# Patient Record
Sex: Male | Born: 1955 | Race: White | Hispanic: No | Marital: Married
Health system: Southern US, Community
[De-identification: ages and names within clinical notes are randomized; demographics above are authoritative.]

---

## 2016-01-29 DIAGNOSIS — M24572 Contracture, left ankle: Secondary | ICD-10-CM | POA: Insufficient documentation

## 2016-01-29 DIAGNOSIS — M722 Plantar fascial fibromatosis: Secondary | ICD-10-CM | POA: Insufficient documentation

## 2016-11-18 DIAGNOSIS — K219 Gastro-esophageal reflux disease without esophagitis: Secondary | ICD-10-CM | POA: Insufficient documentation

## 2016-11-25 DIAGNOSIS — I10 Essential (primary) hypertension: Secondary | ICD-10-CM | POA: Insufficient documentation

## 2017-01-19 DIAGNOSIS — J309 Allergic rhinitis, unspecified: Secondary | ICD-10-CM | POA: Insufficient documentation

## 2017-01-21 DIAGNOSIS — E7800 Pure hypercholesterolemia, unspecified: Secondary | ICD-10-CM | POA: Insufficient documentation

## 2017-01-21 DIAGNOSIS — R7303 Prediabetes: Secondary | ICD-10-CM | POA: Insufficient documentation

## 2017-07-09 DIAGNOSIS — L719 Rosacea, unspecified: Secondary | ICD-10-CM | POA: Insufficient documentation

## 2018-07-03 DIAGNOSIS — Z0000 Encounter for general adult medical examination without abnormal findings: Secondary | ICD-10-CM | POA: Insufficient documentation

## 2019-07-15 DIAGNOSIS — S42255D Nondisplaced fracture of greater tuberosity of left humerus, subsequent encounter for fracture with routine healing: Secondary | ICD-10-CM | POA: Insufficient documentation

## 2020-01-14 DIAGNOSIS — M542 Cervicalgia: Secondary | ICD-10-CM | POA: Insufficient documentation

## 2020-02-29 ENCOUNTER — Other Ambulatory Visit: Payer: Self-pay | Admitting: Podiatry

## 2020-02-29 ENCOUNTER — Encounter: Payer: Self-pay | Admitting: Podiatry

## 2020-02-29 ENCOUNTER — Ambulatory Visit (INDEPENDENT_AMBULATORY_CARE_PROVIDER_SITE_OTHER): Payer: 59 | Admitting: Podiatry

## 2020-02-29 ENCOUNTER — Ambulatory Visit (INDEPENDENT_AMBULATORY_CARE_PROVIDER_SITE_OTHER): Payer: 59

## 2020-02-29 ENCOUNTER — Other Ambulatory Visit: Payer: Self-pay

## 2020-02-29 DIAGNOSIS — M79671 Pain in right foot: Secondary | ICD-10-CM | POA: Diagnosis not present

## 2020-02-29 DIAGNOSIS — M79672 Pain in left foot: Secondary | ICD-10-CM | POA: Diagnosis not present

## 2020-02-29 DIAGNOSIS — M216X9 Other acquired deformities of unspecified foot: Secondary | ICD-10-CM

## 2020-02-29 DIAGNOSIS — M722 Plantar fascial fibromatosis: Secondary | ICD-10-CM

## 2020-02-29 NOTE — Patient Instructions (Signed)

## 2020-03-02 ENCOUNTER — Telehealth: Payer: Self-pay | Admitting: *Deleted

## 2020-03-02 ENCOUNTER — Encounter: Payer: Self-pay | Admitting: Podiatry

## 2020-03-02 NOTE — Telephone Encounter (Signed)
Pt has called to check the status on the work note he would like to get from Dr.Price  He would to return to work 03/06/20 please advise

## 2020-03-02 NOTE — Telephone Encounter (Signed)
Patient is requesting a work note  to be out for the next few days since he is still having heel pain and injection is not helping.He is unable to ice or stretch because of working 12 hour shifts. Please address the note to Attn:Steven Stoddard at [employer]. Patient will pick up when ready.

## 2020-03-03 NOTE — Telephone Encounter (Signed)
Ok for this note? 

## 2020-03-28 ENCOUNTER — Other Ambulatory Visit: Payer: Self-pay

## 2020-03-28 ENCOUNTER — Ambulatory Visit (INDEPENDENT_AMBULATORY_CARE_PROVIDER_SITE_OTHER): Payer: 59 | Admitting: Podiatry

## 2020-03-28 DIAGNOSIS — M792 Neuralgia and neuritis, unspecified: Secondary | ICD-10-CM | POA: Diagnosis not present

## 2020-03-28 DIAGNOSIS — M722 Plantar fascial fibromatosis: Secondary | ICD-10-CM

## 2020-03-28 MED ORDER — METHYLPREDNISOLONE 4 MG PO TBPK
ORAL_TABLET | ORAL | 0 refills | Status: DC
Start: 2020-03-28 — End: 2020-10-02

## 2020-03-28 NOTE — Progress Notes (Signed)
  Subjective:  Patient ID: Terry Neal, male    DOB: [birth year],  MRN: [id number]  Chief Complaint  Patient presents with  . Foot Pain    BL bottom heel pain -wrose at Rt (nonstop pain) x last Thurs; 6/10 sharp pains -wrose with wlaking -w/ swelling on Rt -pt states he was dx with BL PF 20 yrs ago Tx: IBU, OTC insert -w/ numbness    64 y.o. male presents with the above complaint. History confirmed with patient.   Objective:  Physical Exam: warm, good capillary refill, no trophic changes or ulcerative lesions, normal DP and PT pulses and normal sensory exam. Left Foot: tenderness to palpation medial calcaneal tuber, no pain with calcaneal squeeze, decreased ankle joint ROM and +Silverskiold test Right Foot: tenderness to palpation medial calcaneal tuber, no pain with calcaneal squeeze, decreased ankle joint ROM and +Silverskiold test normal exam, no swelling, tenderness, instability; ligaments intact, full range of motion of all ankle/foot joints other than findings noted above.  Radiographs: X-ray of both feet: no evidence of calcaneal stress fracture, plantar calcaneal spur, posterior calcaneal spur and Haglund deformity noted  Assessment:   1. Plantar fasciitis   2. Equinus deformity of foot   3. Pain of both heels      Plan:  Patient was evaluated and treated and all questions answered.  Plantar Fasciitis -XR reviewed with patient -Educated patient on stretching and icing of the affected limb -Injection delivered to the plantar fascia of both feet. -Dispense Plantar fascial brace  Procedure: Injection Tendon/Ligament Consent: Verbal consent obtained. Location: Bilateral plantar fascia at the glabrous junction; medial approach. Skin Prep: Alcohol. Injectate: 1 cc 0.5% marcaine plain, 1 cc dexamethasone phosphate, 0.5 cc kenalog 10. Disposition: Patient tolerated procedure well. Injection site dressed with a band-aid.  Return in about 3 weeks (around 03/21/2020).

## 2020-03-28 NOTE — Progress Notes (Signed)
°  Subjective:  Patient ID: Terry Neal, male    DOB: 03-11-1956,  MRN: [id number]  Chief Complaint  Patient presents with   Plantar Fasciitis    F/U PF Pt. states,' IF I sit any amount of time it's extremely painful until I walk on it for a while. I also feel like my feet are swelling." - sharp pains on both feet (8/10), pt states he has done a lot of walking Tx: oTC topical, stretching, icing -w/ numbness form BL arches to plantar forefoote    64 y.o. male presents with the above complaint. History confirmed with patient.   Objective:  Physical Exam: warm, good capillary refill, no trophic changes or ulcerative lesions, normal DP and PT pulses and normal sensory exam. Left Foot: tenderness to palpation medial calcaneal tuber, no pain with calcaneal squeeze, decreased ankle joint ROM and +Silverskiold test Right Foot: tenderness to palpation medial calcaneal tuber, no pain with calcaneal squeeze, decreased ankle joint ROM and +Silverskiold test normal exam, no swelling, tenderness, instability; ligaments intact, full range of motion of all ankle/foot joints other than findings noted above.  Assessment:   1. Plantar fasciitis   2. Neuritis      Plan:  Patient was evaluated and treated and all questions answered.  Plantar Fasciitis, possible baxter's neuritis -Rx Medrol Pack. Discussed r/b use of medication -Continue stretching and icing  Return in about 1 month (around 04/27/2020).

## 2020-04-25 ENCOUNTER — Ambulatory Visit (INDEPENDENT_AMBULATORY_CARE_PROVIDER_SITE_OTHER): Payer: 59 | Admitting: Podiatry

## 2020-04-25 ENCOUNTER — Encounter: Payer: Self-pay | Admitting: Podiatry

## 2020-04-25 ENCOUNTER — Other Ambulatory Visit: Payer: Self-pay

## 2020-04-25 DIAGNOSIS — M722 Plantar fascial fibromatosis: Secondary | ICD-10-CM | POA: Diagnosis not present

## 2020-04-25 MED ORDER — MELOXICAM 15 MG PO TABS
15.0000 mg | ORAL_TABLET | Freq: Every day | ORAL | 0 refills | Status: DC
Start: 2020-04-25 — End: 2020-12-07

## 2020-04-25 NOTE — Progress Notes (Signed)
°  Subjective:  Patient ID: Terry Neal, male    DOB: 29-Jun-1956,  MRN: [id number]  Chief Complaint  Patient presents with   Plantar Fasciitis    heel pain is not any better and the medicine nor the shot helped and my feet still swell    64 y.o. male presents with the above complaint. History confirmed with patient.   Objective:  Physical Exam: warm, good capillary refill, no trophic changes or ulcerative lesions, normal DP and PT pulses and normal sensory exam. Left Foot: tenderness to palpation medial calcaneal tuber, no pain with calcaneal squeeze, decreased ankle joint ROM and +Silverskiold test Right Foot: tenderness to palpation medial calcaneal tuber, no pain with calcaneal squeeze, decreased ankle joint ROM and +Silverskiold test normal exam, no swelling, tenderness, instability; ligaments intact, full range of motion of all ankle/foot joints other than findings noted above.  Assessment:   1. Plantar fasciitis      Plan:  Patient was evaluated and treated and all questions answered.  Plantar Fasciitis, possible baxter's neuritis -Rx meloxicam -Refer to PT -Plantar fascial taping applied bilateral  Procedure: Plantar fascial taping. Location: Bilateral plantar fascia Skin Prep: spray adhesive. Technique: Following spray adhesive, a broad piece of moleskine was applied to the plantar foot, followed by interlocking pieces of athletic tape in order to lift and rest the plantar fascia. Disposition: Educated on post-taping care.    No follow-ups on file.

## 2020-05-15 DIAGNOSIS — J0181 Other acute recurrent sinusitis: Secondary | ICD-10-CM | POA: Insufficient documentation

## 2020-05-17 DIAGNOSIS — U071 COVID-19: Secondary | ICD-10-CM | POA: Insufficient documentation

## 2020-06-12 ENCOUNTER — Ambulatory Visit (INDEPENDENT_AMBULATORY_CARE_PROVIDER_SITE_OTHER): Payer: 59 | Admitting: Podiatry

## 2020-06-12 ENCOUNTER — Other Ambulatory Visit: Payer: Self-pay

## 2020-06-12 DIAGNOSIS — M722 Plantar fascial fibromatosis: Secondary | ICD-10-CM | POA: Diagnosis not present

## 2020-06-12 NOTE — Progress Notes (Signed)
  Subjective:  Patient ID: Terry Neal, male    DOB: Jan 02, 1956,  MRN: [id number]  Chief Complaint  Patient presents with  . Plantar Fasciitis    F/U BL PF Pt. states," PT was doing great, feet were doing great until I got hospitalized for 9 days. At hospital feet started swelling and with pain; 4-5/10 uncomfortable pain." - w/ limping Tx: PT and stretching     64 y.o. male presents with the above complaint. History confirmed with patient.   Objective:  Physical Exam: warm, good capillary refill, no trophic changes or ulcerative lesions, normal DP and PT pulses and normal sensory exam. Left Foot: tenderness to palpation medial calcaneal tuber, no pain with calcaneal squeeze, decreased ankle joint ROM and +Silverskiold test Right Foot: tenderness to palpation medial calcaneal tuber, no pain with calcaneal squeeze, decreased ankle joint ROM and +Silverskiold test normal exam, no swelling, tenderness, instability; ligaments intact, full range of motion of all ankle/foot joints other than findings noted above.  Assessment:   1. Plantar fasciitis    Plan:  Patient was evaluated and treated and all questions answered.  Plantar Fasciitis, possible baxter's neuritis -Continue PT -Taping reapplied bilat  Procedure: Plantar fascial taping. Location: Bilateral plantar fascia Skin Prep: spray adhesive. Technique: Following spray adhesive, a broad piece of moleskine was applied to the plantar foot, followed by interlocking pieces of athletic tape in order to lift and rest the plantar fascia. Disposition: Educated on post-taping care.  No follow-ups on file.

## 2020-10-02 ENCOUNTER — Ambulatory Visit (INDEPENDENT_AMBULATORY_CARE_PROVIDER_SITE_OTHER): Payer: 59 | Admitting: Podiatry

## 2020-10-02 ENCOUNTER — Other Ambulatory Visit: Payer: Self-pay

## 2020-10-02 DIAGNOSIS — M792 Neuralgia and neuritis, unspecified: Secondary | ICD-10-CM

## 2020-10-02 DIAGNOSIS — L600 Ingrowing nail: Secondary | ICD-10-CM | POA: Diagnosis not present

## 2020-10-02 DIAGNOSIS — M722 Plantar fascial fibromatosis: Secondary | ICD-10-CM

## 2020-10-02 MED ORDER — METHYLPREDNISOLONE 4 MG PO TBPK: ORAL_TABLET | ORAL | 0 refills | Status: DC

## 2020-10-02 NOTE — Patient Instructions (Signed)

## 2020-10-02 NOTE — Progress Notes (Signed)
  Subjective:  Patient ID: Terry Neal, male    DOB: 1955/12/17,  MRN: [id number]  Chief Complaint  Patient presents with  . Nail Problem    Lt hallux medial border x last week, no pain -w/ redness and slight swelling Tx; polysporin    . Foot Pain    Pt C/o Rt midfoot pain started couple wks ago. Pt denies injury. Tx: PT (with slight improvement)    64 y.o. male presents with the above complaint. History confirmed with patient.   Objective:  Physical Exam: warm, good capillary refill, no trophic changes or ulcerative lesions, normal DP and PT pulses and normal sensory exam.  Painful ingrowing nail at  medial border of the left, hallux; local warmth noted and sanguinous drainage noted. POP 2nd interspace right, left medial calc tuber.  Assessment:   1. Plantar fasciitis   2. Neuritis   3. Ingrown nail    Plan:  Patient was evaluated and treated and all questions answered.  Ingrown Nail, left -Patient elects to proceed with ingrown toenail removal today -Ingrown nail excised. See procedure note. -Educated on post-procedure care including soaking. Written instructions provided.  Procedure: Avulsion of toenail Location: Left 1st toe  Anesthesia: Lidocaine 1% plain; 1.5 mL and Marcaine 0.5% plain; 1.5 mL, digital block. Skin Prep: Betadine. Dressing: Silvadene; telfa; dry, sterile, compression dressing. Technique: Following skin prep, the toe was exsanguinated and a tourniquet was secured at the base of the toe. The medial nail border was freed, split, and avulsed with a hemostat. The area was cleansed. The tourniquet was then removed and sterile dressing applied. Disposition: Patient tolerated procedure well.   Plantar fasciitis left, neuroma right -Rx medrol pack -Consider injections if symptomatic next visit.  Return in about 1 month (around 11/02/2020) for Plantar fasciitis, Nail Check, Neuroma.

## 2020-11-02 ENCOUNTER — Ambulatory Visit: Payer: 59 | Admitting: Podiatry

## 2020-11-09 ENCOUNTER — Other Ambulatory Visit: Payer: Self-pay

## 2020-11-09 ENCOUNTER — Ambulatory Visit (INDEPENDENT_AMBULATORY_CARE_PROVIDER_SITE_OTHER): Payer: 59 | Admitting: Podiatry

## 2020-11-09 ENCOUNTER — Encounter: Payer: Self-pay | Admitting: Podiatry

## 2020-11-09 DIAGNOSIS — M722 Plantar fascial fibromatosis: Secondary | ICD-10-CM

## 2020-11-09 DIAGNOSIS — G5781 Other specified mononeuropathies of right lower limb: Secondary | ICD-10-CM

## 2020-11-09 DIAGNOSIS — L600 Ingrowing nail: Secondary | ICD-10-CM

## 2020-11-09 MED ORDER — BETAMETHASONE SOD PHOS & ACET 6 (3-3) MG/ML IJ SUSP
15.0000 mg | Freq: Once | INTRAMUSCULAR | Status: AC
  Administered 2020-11-09: 15 mg

## 2020-11-09 NOTE — Progress Notes (Signed)
  Subjective:  Patient ID: Royale Swamy, male    DOB: 12/15/1955,  MRN: [id number]  Chief Complaint  Patient presents with  . Nail Problem    The toe is doing fine  . Plantar Fasciitis    The left heel is still hurting and the medicine did good for a while and the heel is starting to hurt again    65 y.o. male presents with the above complaint. History confirmed with patient.   Objective:  Physical Exam: warm, good capillary refill, no trophic changes or ulcerative lesions, normal DP and PT pulses and normal sensory exam.  Ingrown nail site well healed. POP 2nd interspace right, bilatmedial calc tuber.  Assessment:   1. Plantar fasciitis   2. Interdigital neuroma of right foot   3. Ingrown nail    Plan:  Patient was evaluated and treated and all questions answered.  Plantar fasciitis bilat, neuroma right -Injections as below  Procedure: Injection Tendon/Ligament Consent: Verbal consent obtained. Location: Bilateral plantar fascia at the glabrous junction; medial approach. Skin Prep: Alcohol. Injectate: 1 cc 0.5% marcaine plain, 1 cc celestone Disposition: Patient tolerated procedure well. Injection site dressed with a band-aid.  Procedure: Neuroma Injection Location: Right 1st interspace Skin Prep: Alcohol. Injectate: 0.5 cc 0.5% marcaine plain, 0.5 cc celestone phosphate. Disposition: Patient tolerated procedure well. Injection site dressed with a band-aid.  Ingrown Nail -Healing well. Discussed traumatic etiology of nail dystrophy to left 1st and 2nd toenails.  Return in about 3 weeks (around 11/30/2020) for Plantar fasciitis.

## 2020-12-07 ENCOUNTER — Ambulatory Visit (INDEPENDENT_AMBULATORY_CARE_PROVIDER_SITE_OTHER): Payer: 59 | Admitting: Podiatry

## 2020-12-07 ENCOUNTER — Other Ambulatory Visit: Payer: Self-pay

## 2020-12-07 DIAGNOSIS — M722 Plantar fascial fibromatosis: Secondary | ICD-10-CM | POA: Diagnosis not present

## 2020-12-07 DIAGNOSIS — G5781 Other specified mononeuropathies of right lower limb: Secondary | ICD-10-CM

## 2020-12-07 DIAGNOSIS — L600 Ingrowing nail: Secondary | ICD-10-CM | POA: Diagnosis not present

## 2020-12-07 MED ORDER — METHYLPREDNISOLONE 4 MG PO TBPK: ORAL_TABLET | ORAL | 0 refills | Status: DC

## 2020-12-07 MED ORDER — MELOXICAM 15 MG PO TABS: 15.0000 mg | ORAL_TABLET | Freq: Every day | ORAL | 0 refills | Status: DC

## 2020-12-07 NOTE — Progress Notes (Signed)
  Subjective:  Patient ID: Terry Neal, male    DOB: 1956-04-30,  MRN: [id number]  Chief Complaint  Patient presents with  . Plantar Fasciitis    F/U LT PF and Rt neuroma -pt states," a little bit better, I still have pain and with randome intense pain; 10/10 sharp pains." - w/ less swelling, with numbness Tx: stretching, voltaren gel and IBU    65 y.o. male presents with the above complaint. History confirmed with patient.   Objective:  Physical Exam: warm, good capillary refill, no trophic changes or ulcerative lesions, normal DP and PT pulses and normal sensory exam.  Ingrown nail site well healed. POP 2nd interspace right, bilatmedial calc tuber.  Assessment:   No diagnosis found. Plan:  Patient was evaluated and treated and all questions answered.  Plantar fasciitis bilat, neuroma right -Hold off injections today prescribed double anti-inflammatory with Medrol pack and meloxicam.  Patient not to take meloxicam until after completion of the steroid pack.  Patient to continue stretching and icing.  Ingrown Nail left lateral -Debrided in slant back fashion  Return in about 4 weeks (around 01/04/2021).

## 2020-12-29 ENCOUNTER — Other Ambulatory Visit: Payer: Self-pay | Admitting: Podiatry

## 2021-01-04 ENCOUNTER — Ambulatory Visit (INDEPENDENT_AMBULATORY_CARE_PROVIDER_SITE_OTHER): Payer: 59 | Admitting: Podiatry

## 2021-01-04 ENCOUNTER — Other Ambulatory Visit: Payer: Self-pay

## 2021-01-04 DIAGNOSIS — M722 Plantar fascial fibromatosis: Secondary | ICD-10-CM

## 2021-01-04 DIAGNOSIS — G5781 Other specified mononeuropathies of right lower limb: Secondary | ICD-10-CM

## 2021-01-04 DIAGNOSIS — M19079 Primary osteoarthritis, unspecified ankle and foot: Secondary | ICD-10-CM | POA: Diagnosis not present

## 2021-01-04 MED ORDER — BETAMETHASONE SOD PHOS & ACET 6 (3-3) MG/ML IJ SUSP
6.0000 mg | Freq: Once | INTRAMUSCULAR | Status: AC
  Administered 2021-01-04: 6 mg

## 2021-01-04 NOTE — Progress Notes (Signed)
  Subjective:  Patient ID: Terry Neal, male    DOB: 1955/11/26,  MRN: [id number]  Chief Complaint  Patient presents with  . Follow-up    Lt heel doing a lot better- Right top of foot still having pain- mentioned when taking dose pak he felt relief in both feet-    65 y.o. male presents with the above complaint. History confirmed with patient.   Objective:  Physical Exam: warm, good capillary refill, no trophic changes or ulcerative lesions, normal DP and PT pulses and normal sensory exam.  Ingrown nail site well healed. Mild POP medial calc tuber left Right dorsal midfoot POP with prominent osteophytes  Assessment:   1. Arthritis, midfoot   2. Plantar fasciitis   3. Interdigital neuroma of right foot    Plan:  Patient was evaluated and treated and all questions answered.  Plantar fasciitis bilat, neuroma right -Appear improved  Midfoot arthritis right -Injection as below  Procedure: Joint Injection Location: Right 2nd TMT joint Skin Prep: Alcohol. Injectate: 0.5 cc 1% lidocaine plain, 0.5 cc betamethasone acetate-betamethasone sodium phosphate Disposition: Patient tolerated procedure well. Injection site dressed with a band-aid.    ?TTS bilat -Would consider diagnostic injection next visit.  Return in about 6 weeks (around 02/15/2021) for Plantar fasciitis, Arthritis.

## 2021-01-26 ENCOUNTER — Other Ambulatory Visit: Payer: Self-pay | Admitting: Podiatry

## 2021-02-19 ENCOUNTER — Ambulatory Visit (INDEPENDENT_AMBULATORY_CARE_PROVIDER_SITE_OTHER): Payer: 59 | Admitting: Podiatry

## 2021-02-19 ENCOUNTER — Other Ambulatory Visit: Payer: Self-pay

## 2021-02-19 DIAGNOSIS — M722 Plantar fascial fibromatosis: Secondary | ICD-10-CM

## 2021-02-19 DIAGNOSIS — M19071 Primary osteoarthritis, right ankle and foot: Secondary | ICD-10-CM | POA: Diagnosis not present

## 2021-02-19 DIAGNOSIS — M19079 Primary osteoarthritis, unspecified ankle and foot: Secondary | ICD-10-CM

## 2021-02-21 DIAGNOSIS — M19071 Primary osteoarthritis, right ankle and foot: Secondary | ICD-10-CM | POA: Diagnosis not present

## 2021-02-21 DIAGNOSIS — M722 Plantar fascial fibromatosis: Secondary | ICD-10-CM | POA: Diagnosis not present

## 2021-02-21 MED ORDER — BETAMETHASONE SOD PHOS & ACET 6 (3-3) MG/ML IJ SUSP
9.0000 mg | Freq: Once | INTRAMUSCULAR | Status: AC
  Administered 2021-02-21: 9 mg

## 2021-02-21 NOTE — Progress Notes (Signed)
  Subjective:  Patient ID: Terry Neal, male    DOB: 05-20-1956,  MRN: [id number]  Chief Complaint  Patient presents with  . Plantar Fasciitis    F/U BL PF and arthritiis -pt states," improving. Lt foot not as bad but with the Rt pain from ankle down the foot." Tx: meloxicam    65 y.o. male presents with the above complaint. History confirmed with patient.   Objective:  Physical Exam: warm, good capillary refill, no trophic changes or ulcerative lesions, normal DP and PT pulses and normal sensory exam.  Ingrown nail site well healed. Mild POP medial calc tuber left Right dorsal midfoot POP with prominent osteophytes  Assessment:   1. Arthritis, midfoot   2. Plantar fasciitis    Plan:  Patient was evaluated and treated and all questions answered.  Plantar fasciitis bilat, neuroma right -Appears worsened on the left side we discussed getting 1 final injection today we will consider surgical invention should issues persist  Midfoot arthritis right -Still having pain final injections as below  Procedure: Injection Tendon/Ligament Consent: Verbal consent obtained. Location: Left plantar fascia at the glabrous junction; medial approach. Skin Prep: Alcohol. Injectate: 1 cc 0.5% marcaine plain, 1 cc betamethasone acetate-betamethasone sodium phosphate Disposition: Patient tolerated procedure well. Injection site dressed with a band-aid.   Procedure: Joint Injection Location: Right 2nd TMT joint Skin Prep: Alcohol. Injectate: 0.5 cc 1% lidocaine plain, 0.5 cc betamethasone acetate-betamethasone sodium phosphate Disposition: Patient tolerated procedure well. Injection site dressed with a band-aid.  Return in about 1 month (around 03/22/2021) for Plantar fasciitis, Left, Arthritis, Right.

## 2021-03-29 ENCOUNTER — Ambulatory Visit (INDEPENDENT_AMBULATORY_CARE_PROVIDER_SITE_OTHER): Payer: 59 | Admitting: Podiatry

## 2021-03-29 ENCOUNTER — Other Ambulatory Visit: Payer: Self-pay

## 2021-03-29 ENCOUNTER — Encounter: Payer: Self-pay | Admitting: Podiatry

## 2021-03-29 DIAGNOSIS — M722 Plantar fascial fibromatosis: Secondary | ICD-10-CM | POA: Diagnosis not present

## 2021-03-29 DIAGNOSIS — M19079 Primary osteoarthritis, unspecified ankle and foot: Secondary | ICD-10-CM | POA: Diagnosis not present

## 2021-03-29 NOTE — Progress Notes (Signed)
  Subjective:  Patient ID: Terry Neal, male    DOB: 07/10/1956,  MRN: [id number]  Chief Complaint  Patient presents with   Plantar Fasciitis    I am doing ok and maybe a little improvement and it still is stiff and hurts after I get up on both feet    65 y.o. male presents with the above complaint. History confirmed with patient.   Objective:  Physical Exam: warm, good capillary refill, no trophic changes or ulcerative lesions, normal DP and PT pulses and normal sensory exam.  Ingrown nail right lateral great toenail border without warmth erythema signs of infection Mild POP medial calc tuber left Right dorsal midfoot POP with prominent osteophytes  Assessment:   No diagnosis found.  Plan:  Patient was evaluated and treated and all questions answered.  Plantar fasciitis left midfoot arthritis right -Still having significant pain noticed slightly improved.  Discussed with patient midfoot exostectomy right for reduction of pain as well as injection of platelet rich plasma to the left heel.  Discussed surgeries in detail discussed risk benefits and alternatives.  At this point we have maximized conservative therapy.   -Patient has failed all conservative therapy and wishes to proceed with surgical intervention. All risks, benefits, and alternatives discussed with patient. No guarantees given. Consent reviewed and signed by patient. -Planned procedures: Right midfoot exostectomy, left heel injection of platelet rich plasma  Total time for visit both face-to-face and non face-to-face including patient care, review of chart/imaging, documentation: 33 mins    No follow-ups on file.

## 2021-05-17 ENCOUNTER — Ambulatory Visit (INDEPENDENT_AMBULATORY_CARE_PROVIDER_SITE_OTHER): Payer: 59 | Admitting: Podiatry

## 2021-05-17 ENCOUNTER — Encounter: Payer: Self-pay | Admitting: Podiatry

## 2021-05-17 ENCOUNTER — Other Ambulatory Visit: Payer: Self-pay

## 2021-05-17 DIAGNOSIS — M79676 Pain in unspecified toe(s): Secondary | ICD-10-CM

## 2021-05-17 DIAGNOSIS — L601 Onycholysis: Secondary | ICD-10-CM | POA: Diagnosis not present

## 2021-05-17 NOTE — Progress Notes (Signed)
  Subjective:  Patient ID: Per Beagley, male    DOB: 21-Dec-1955,  MRN: [id number]  Chief Complaint  Patient presents with   Plantar Fasciitis    The left heel is ok    Foot Pain    The right ankle area still hurts and I think I have some arthritis and the right top of the foot hurts   Nail Problem    The right big toe nail I trimmed last week and got some puss out last week    65 y.o. male presents with the above complaint. History confirmed with patient.   Objective:  Physical Exam: warm, good capillary refill, no trophic changes or ulcerative lesions, normal DP and PT pulses, and normal sensory exam.  Painful loose nail at the proximal aspect of the right hallux with subungual hemorrhage of the digit notedno warmth no erythema Assessment:   1. Onycholysis   2. Pain around toenail      Plan:  Patient was evaluated and treated and all questions answered.  Onycholysis, right -Discussed with patient performing avulsion due to proximal lysis of the toenail.  Patient agrees to proceed  Procedure: Avulsion of toenail Location: Right 1st toe  Anesthesia: Lidocaine 1% plain; 1.5 mL and Marcaine 0.5% plain; 1.5 mL, digital block. Skin Prep: Betadine. Dressing: Silvadene; telfa; dry, sterile, compression dressing. Technique: Following skin prep, the toe was exsanguinated and a tourniquet was secured at the base of the toe. The nail was freed and avulsed with a hemostat. The area was cleansed. The tourniquet was then removed and sterile dressing applied. Disposition: Patient tolerated procedure well.  No follow-ups on file.   MDM

## 2021-05-31 ENCOUNTER — Other Ambulatory Visit: Payer: Self-pay

## 2021-05-31 ENCOUNTER — Ambulatory Visit (INDEPENDENT_AMBULATORY_CARE_PROVIDER_SITE_OTHER): Payer: 59 | Admitting: Podiatry

## 2021-05-31 DIAGNOSIS — L600 Ingrowing nail: Secondary | ICD-10-CM | POA: Diagnosis not present

## 2021-05-31 DIAGNOSIS — M19071 Primary osteoarthritis, right ankle and foot: Secondary | ICD-10-CM | POA: Diagnosis not present

## 2021-05-31 DIAGNOSIS — M19079 Primary osteoarthritis, unspecified ankle and foot: Secondary | ICD-10-CM | POA: Diagnosis not present

## 2021-05-31 NOTE — Progress Notes (Signed)
  Subjective:  Patient ID: Terry Neal, male    DOB: 1956/08/19,  MRN: [id number]  No chief complaint on file.  65 y.o. male presents with the above complaint. History confirmed with patient. States the nail is not draining, has no pain. He is having continued pain in his ankle and the top of the foot for which he would like to discuss options.   Objective:  Physical Exam: warm, good capillary refill, no trophic changes or ulcerative lesions, normal DP and PT pulses and normal sensory exam.  Right hallux nail bed healing well without issue. Mild POP medial calc tuber left Right dorsal midfoot POP with prominent osteophytes, maximal pain at the 4th TMT area. POP lateral ankle gutter, no pain at medial gutter, no pain at the ATFL. Pain on ankle ROM.  Assessment:   1. Arthritis of ankle, right   2. Arthritis, midfoot   3. Ingrown nail     Plan:  Patient was evaluated and treated and all questions answered.  Right ankle pain, r/o OCD -Order MRI for further evaluation.   Ingrown Nail -Healing well. No debridement, no need to apply ointment or soak.  Return in about 3 weeks (around 06/21/2021) for MRI review.

## 2021-06-06 ENCOUNTER — Telehealth: Payer: Self-pay

## 2021-06-06 NOTE — Telephone Encounter (Signed)
Pt called stating he was seen last Thursday and still hasn't heard anything about his MRI appt.

## 2021-06-06 NOTE — Telephone Encounter (Signed)
Thank you I will let the pt know of appt date and time

## 2021-06-06 NOTE — Telephone Encounter (Signed)
Looks like he has appt already  06/13/2021  6:30 AM [facility] AT [address]  He can call [facility] if he needs to change or reschedule it. [phone]

## 2021-06-12 ENCOUNTER — Telehealth: Payer: Self-pay

## 2021-06-12 NOTE — Telephone Encounter (Signed)
Pt called stating he received a letter from his Insurance stating they have denied his request for the MRI. MRI is not covered. Pt would like to know what's next step. Please advice

## 2021-06-12 NOTE — Telephone Encounter (Signed)
Please let patient know I will review the paperwork when we receive it and do an appeal. Nothing he needs to do on his side right now

## 2021-06-13 ENCOUNTER — Other Ambulatory Visit: Payer: 59

## 2021-06-13 NOTE — Telephone Encounter (Signed)
Pt was notified that Dr. Samuella Cota will review the paperwork when we receive it and do an appeal. Pt stated understanding

## 2021-06-21 ENCOUNTER — Ambulatory Visit: Payer: 59 | Admitting: Podiatry

## 2021-06-30 ENCOUNTER — Other Ambulatory Visit: Payer: Self-pay

## 2021-06-30 ENCOUNTER — Ambulatory Visit
Admission: RE | Admit: 2021-06-30 | Discharge: 2021-06-30 | Disposition: A | Discharge status: Home / Self Care | Payer: 59 | Source: Ambulatory Visit | Attending: Podiatry | Admitting: Podiatry

## 2021-06-30 ENCOUNTER — Encounter: Payer: Self-pay | Admitting: Podiatry

## 2021-06-30 DIAGNOSIS — M19071 Primary osteoarthritis, right ankle and foot: Secondary | ICD-10-CM

## 2021-07-05 ENCOUNTER — Other Ambulatory Visit: Payer: Self-pay

## 2021-07-05 ENCOUNTER — Encounter: Payer: Self-pay | Admitting: Podiatry

## 2021-07-05 ENCOUNTER — Ambulatory Visit (INDEPENDENT_AMBULATORY_CARE_PROVIDER_SITE_OTHER): Payer: 59 | Admitting: Podiatry

## 2021-07-05 DIAGNOSIS — M7671 Peroneal tendinitis, right leg: Secondary | ICD-10-CM | POA: Diagnosis not present

## 2021-07-05 DIAGNOSIS — M722 Plantar fascial fibromatosis: Secondary | ICD-10-CM | POA: Diagnosis not present

## 2021-07-05 DIAGNOSIS — M19079 Primary osteoarthritis, unspecified ankle and foot: Secondary | ICD-10-CM

## 2021-07-05 NOTE — Progress Notes (Signed)
  Subjective:  Patient ID: Terry Neal, male    DOB: Jun 26, 1956,  MRN: [id number]  Chief Complaint  Patient presents with   Routine Post Op    Hurts on the right ankle and I am here for the MRI results    65 y.o. male presents with the above complaint. History confirmed with patient.  Had his MRI and is here for review.  States that the pain is worsening not improving.  Objective:  Physical Exam: warm, good capillary refill, no trophic changes or ulcerative lesions, normal DP and PT pulses and normal sensory exam.  Right dorsal midfoot POP with prominent osteophytes, no pain to palpation about the sinus tarsi or medial or lateral ankle gutter.  Pain palpation about the peroneal tendons lateral aspect of his."  About the medial calcaneal tuber  Assessment:   1. Plantar fasciitis   2. Arthritis, midfoot   3. Peroneal tendinitis of right lower extremity     Plan:  Patient was evaluated and treated and all questions answered.  Peroneal tendinitis, midfoot arthritis, plantar fasciitis -MRI reviewed with patient consistent with the above diagnoses -We discussed proceeding with surgical invention as patient has failed conservative therapy for an extended period of time.  We have tried injections, bracing, rest, shoe gear changes without relief --Patient has failed all conservative therapy and wishes to proceed with surgical intervention. All risks, benefits, and alternatives discussed with patient. No guarantees given. Consent reviewed and signed by patient. -Planned procedures: Right ankle repair peroneal tendon, right foot midfoot exostectomy, endoscopic plantar fasciotomy -ASA 2 - Patient with mild systemic disease with no functional limitations  No follow-ups on file.

## 2021-07-16 ENCOUNTER — Encounter: Payer: Self-pay | Admitting: Podiatry

## 2021-07-30 ENCOUNTER — Telehealth: Payer: Self-pay | Admitting: Urology

## 2021-07-30 NOTE — Telephone Encounter (Signed)
DOS - 08/01/21  EPF RIGHT --- 81840 REPAIR TENDON RIGHT --- 28086 TARSAL EXOSTECTOMY RIGHT --- [id number]  [facility] EFFECTIVE DATE - 10/21/20  PLAN DEDUCTIBLE - $600.00 W/ $0.00 REMAINING OUT OF POCKET - $2,500.00 W/ $1,362.90 REMAINING COINSURANCE - 20% COPAY - $0.00    SPOKE WITH ANDREA WITH [employer] SHE STATED THAT FOR CPT CODES 37543 AND [phone] NO PRIOR AUTH IS REQUIRED. FOR CPT CODE 03403 HAS BEEN APPROVED, AUTH # [id number], GOOD FROM 08/01/21 - 10/30/21.  REF# [id number]

## 2021-08-01 ENCOUNTER — Other Ambulatory Visit: Payer: Self-pay | Admitting: Podiatry

## 2021-08-01 ENCOUNTER — Telehealth: Payer: Self-pay

## 2021-08-01 ENCOUNTER — Telehealth: Payer: Self-pay | Admitting: Podiatry

## 2021-08-01 ENCOUNTER — Encounter: Payer: Self-pay | Admitting: Podiatry

## 2021-08-01 ENCOUNTER — Other Ambulatory Visit (INDEPENDENT_AMBULATORY_CARE_PROVIDER_SITE_OTHER): Payer: 59 | Admitting: Podiatry

## 2021-08-01 DIAGNOSIS — M19071 Primary osteoarthritis, right ankle and foot: Secondary | ICD-10-CM | POA: Diagnosis not present

## 2021-08-01 DIAGNOSIS — M722 Plantar fascial fibromatosis: Secondary | ICD-10-CM

## 2021-08-01 DIAGNOSIS — M7671 Peroneal tendinitis, right leg: Secondary | ICD-10-CM | POA: Diagnosis not present

## 2021-08-01 MED ORDER — CEPHALEXIN 500 MG PO CAPS: ORAL_CAPSULE | ORAL | 0 refills | Status: DC

## 2021-08-01 MED ORDER — OXYCODONE-ACETAMINOPHEN 5-325 MG PO TABS: 1.0000 | ORAL_TABLET | ORAL | 0 refills | Status: DC | PRN

## 2021-08-01 NOTE — Progress Notes (Signed)
Knee scooter order placed

## 2021-08-01 NOTE — Telephone Encounter (Signed)
Patient's wife just notified office that [facility] does not have pain medicine prescribed in stock. I have updated the preferred pharmacy to [facility]. Can you please resend all prescriptions to [facility].  Thank you.

## 2021-08-01 NOTE — Telephone Encounter (Signed)
Placed order for knee scooter with [facility] per Dr. Samuella Cota

## 2021-08-06 ENCOUNTER — Other Ambulatory Visit: Payer: Self-pay

## 2021-08-06 ENCOUNTER — Ambulatory Visit (INDEPENDENT_AMBULATORY_CARE_PROVIDER_SITE_OTHER): Payer: 59

## 2021-08-06 ENCOUNTER — Ambulatory Visit (INDEPENDENT_AMBULATORY_CARE_PROVIDER_SITE_OTHER): Payer: 59 | Admitting: Podiatry

## 2021-08-06 DIAGNOSIS — M7671 Peroneal tendinitis, right leg: Secondary | ICD-10-CM | POA: Diagnosis not present

## 2021-08-06 DIAGNOSIS — M216X9 Other acquired deformities of unspecified foot: Secondary | ICD-10-CM | POA: Diagnosis not present

## 2021-08-06 DIAGNOSIS — M19079 Primary osteoarthritis, unspecified ankle and foot: Secondary | ICD-10-CM

## 2021-08-06 NOTE — Progress Notes (Signed)
  Subjective:  Patient ID: Terry Neal, male    DOB: [birth year],  MRN: [id number]  Chief Complaint  Patient presents with   Routine Post Op    POV #1 -pt deneis N/V??Fch -dressing intact -pt states," last couple of days have felt pretty good, only with discomfort; 1/10."     DOS: 08/01/21 Procedure: Right foot EPF, peroneal tendon repair, midfoot exostectomy  65 y.o. male presents with the above complaint. History confirmed with patient. Pain controlled without opioids - only took 2 tablets.   Objective:  Physical Exam: tenderness at the surgical site, local edema noted, and calf supple, nontender. Decreased sensation dorsal midfoot and 2nd/3rd toe areas Incision: healing well, no significant drainage, no dehiscence, no significant erythema  No images are attached to the encounter.  Radiographs: X-ray of the right foot: consistent with post-op state, with reduction of midfoot spurring  Assessment:   1. Peroneal tendinitis of right lower extremity   2. Arthritis, midfoot   3. Equinus deformity of foot    Plan:  Patient was evaluated and treated and all questions answered.  Post-operative State -XR reviewed with patient -Dressing applied consisting of povidone, sterile gauze, kerlix, and ACE bandage -NWB with knee scooter -XRs needed at follow-up: none -Will plan to accelerate WB given minimal tendon degeneration. Plan for it in 1-2 weeks if tolerated.   No follow-ups on file.

## 2021-08-06 NOTE — Telephone Encounter (Signed)
Previously addressed.

## 2021-08-06 NOTE — Patient Instructions (Signed)
Keep scheduled appt.

## 2021-08-07 ENCOUNTER — Encounter: Payer: 59 | Admitting: Podiatry

## 2021-08-15 DIAGNOSIS — Z2821 Immunization not carried out because of patient refusal: Secondary | ICD-10-CM | POA: Insufficient documentation

## 2021-08-20 ENCOUNTER — Other Ambulatory Visit: Payer: Self-pay

## 2021-08-20 ENCOUNTER — Ambulatory Visit (INDEPENDENT_AMBULATORY_CARE_PROVIDER_SITE_OTHER): Payer: 59 | Admitting: Podiatry

## 2021-08-20 DIAGNOSIS — M19079 Primary osteoarthritis, unspecified ankle and foot: Secondary | ICD-10-CM

## 2021-08-20 DIAGNOSIS — Z9889 Other specified postprocedural states: Secondary | ICD-10-CM

## 2021-08-20 DIAGNOSIS — M7671 Peroneal tendinitis, right leg: Secondary | ICD-10-CM

## 2021-08-20 NOTE — Progress Notes (Signed)
  Subjective:  Patient ID: Terry Neal, male    DOB: [birth year],  MRN: [id number]  Chief Complaint  Patient presents with   Routine Post Op    POV #2 -pt denies N/V/F/Ch - dressing intact - pt states he is having mild discomfort; 2/10 -no seriouis pain - Tx: boot, eelvatoin, scooter and IBU as PRN     DOS: 08/01/21 Procedure: Right foot EPF, peroneal tendon repair, midfoot exostectomy  65 y.o. male presents with the above complaint. History confirmed with patient.  Objective:  Physical Exam: tenderness at the surgical site, local edema noted, and calf supple, nontender. Decreased sensation dorsal midfoot and 2nd/3rd toe areas Incision: healing well, no significant drainage, no dehiscence, no significant erythema Assessment:   1. Arthritis, midfoot   2. Peroneal tendinitis of right lower extremity   3. Post-operative state     Plan:  Patient was evaluated and treated and all questions answered.  Post-operative State -Staples removed -Dressing applied consisting of povidone, sterile gauze, kerlix, and ACE bandage -NWB with knee scooter -XRs needed at follow-up: none -Can start WB as comfortable. -Remainder of suture removal next week.   No follow-ups on file.

## 2021-08-21 ENCOUNTER — Encounter: Payer: 59 | Admitting: Podiatry

## 2021-08-27 ENCOUNTER — Ambulatory Visit (INDEPENDENT_AMBULATORY_CARE_PROVIDER_SITE_OTHER): Payer: 59 | Admitting: Podiatry

## 2021-08-27 ENCOUNTER — Other Ambulatory Visit: Payer: Self-pay

## 2021-08-27 DIAGNOSIS — M7671 Peroneal tendinitis, right leg: Secondary | ICD-10-CM

## 2021-08-27 DIAGNOSIS — Z9889 Other specified postprocedural states: Secondary | ICD-10-CM

## 2021-08-27 DIAGNOSIS — M19079 Primary osteoarthritis, unspecified ankle and foot: Secondary | ICD-10-CM

## 2021-08-27 NOTE — Progress Notes (Signed)
  Subjective:  Patient ID: Terry Neal, male    DOB: [birth year],  MRN: [id number]  Chief Complaint  Patient presents with   Routine Post Op    POV # 3 -pt states 4-5/10 pain at acrh and dorsal mifood otherwise doing fine -WB with cam boot    DOS: 08/01/21 Procedure: Right foot EPF, peroneal tendon repair, midfoot exostectomy  65 y.o. male presents with the above complaint. History confirmed with patient.  Objective:  Physical Exam: tenderness at the surgical site, local edema noted, and calf supple, nontender. Decreased sensation dorsal midfoot and 2nd/3rd toe areas Incision: healing well, no significant drainage, no dehiscence, no significant erythema Assessment:   1. Arthritis, midfoot   2. Peroneal tendinitis of right lower extremity   3. Post-operative state    Plan:  Patient was evaluated and treated and all questions answered.  Post-operative State -Sutures removed -Staples removed -Steri-strips applied to the incision -Ok to start showering at this time. Advised they cannot soak. -Surgical shoe dispensed -WBAT in CAM boot, start WB in surgical shoe next Monday -XRs needed at follow-up: none  Return in about 2 weeks (around 09/10/2021) for Post-Op (No XRs).

## 2021-09-10 ENCOUNTER — Ambulatory Visit (INDEPENDENT_AMBULATORY_CARE_PROVIDER_SITE_OTHER): Payer: 59 | Admitting: Podiatry

## 2021-09-10 ENCOUNTER — Ambulatory Visit (INDEPENDENT_AMBULATORY_CARE_PROVIDER_SITE_OTHER): Payer: 59

## 2021-09-10 DIAGNOSIS — M19079 Primary osteoarthritis, unspecified ankle and foot: Secondary | ICD-10-CM | POA: Diagnosis not present

## 2021-09-10 DIAGNOSIS — M7671 Peroneal tendinitis, right leg: Secondary | ICD-10-CM | POA: Diagnosis not present

## 2021-09-10 DIAGNOSIS — Z9889 Other specified postprocedural states: Secondary | ICD-10-CM

## 2021-09-10 NOTE — Progress Notes (Signed)
  Subjective:  Patient ID: Terry Neal, male    DOB: November 13, 1955,  MRN: [id number]  Chief Complaint  Patient presents with   Routine Post Op    POV -pt denies N/V/f?Ch -pt states or c/o a lot of pain from medial arch that radiates to ankle x yesterday - w/ swelling and redness Tx: sx shoe, tylenol    DOS: 08/01/21 Procedure: Right foot EPF, peroneal tendon repair, midfoot exostectomy  65 y.o. male presents with the above complaint. History confirmed with patient.  Objective:  Physical Exam: tenderness at the surgical site, local edema noted, and calf supple, nontender. Decreased sensation dorsal midfoot and 2nd/3rd toe areas. Continued edema dorsal midfoot and ankle area Incision: healed. Assessment:   1. Peroneal tendinitis of right lower extremity   2. Arthritis, midfoot   3. Post-operative state     Plan:  Patient was evaluated and treated and all questions answered.  Post-operative State -XR taken and reviewed no interval changes. -Edema noted today, applied unna boot to reduce -I think clinically he is doing ok I think the swelling is the biggest source of his issue and is likely worsened from transitioning shoegear. -XRs needed at follow-up: none  Return in about 10 days (around 09/20/2021) for Post-Op (No XRs).

## 2021-09-20 ENCOUNTER — Encounter: Payer: Self-pay | Admitting: Podiatry

## 2021-09-20 ENCOUNTER — Ambulatory Visit (INDEPENDENT_AMBULATORY_CARE_PROVIDER_SITE_OTHER): Payer: 59 | Admitting: Podiatry

## 2021-09-20 DIAGNOSIS — M7671 Peroneal tendinitis, right leg: Secondary | ICD-10-CM

## 2021-09-20 DIAGNOSIS — M19079 Primary osteoarthritis, unspecified ankle and foot: Secondary | ICD-10-CM

## 2021-09-20 DIAGNOSIS — Z9889 Other specified postprocedural states: Secondary | ICD-10-CM

## 2021-09-20 DIAGNOSIS — M722 Plantar fascial fibromatosis: Secondary | ICD-10-CM

## 2021-09-21 ENCOUNTER — Telehealth: Payer: Self-pay | Admitting: Podiatry

## 2021-09-21 MED ORDER — MELOXICAM 15 MG PO TABS: 15.0000 mg | ORAL_TABLET | Freq: Every day | ORAL | 0 refills | Status: AC

## 2021-09-21 NOTE — Telephone Encounter (Signed)
Pt was to get Anti-inflammatory yesterday but pharmacy did not have [facility]

## 2021-09-21 NOTE — Addendum Note (Signed)
Addended by: Ventura Sellers on: 09/21/2021 11:34 AM   Modules accepted: Orders

## 2021-09-24 NOTE — Progress Notes (Signed)
  Subjective:  Patient ID: Terry Neal, male    DOB: Feb 27, 1956,  MRN: [id number]  Chief Complaint  Patient presents with   Routine Post Op    I am doing better with the right foot and does swell and have good and bad days and pain in crease of right ankle and shooting pain and I have had tennis shoes on since Sunday and also take ibuprofen   DOS: 08/01/21 Procedure: Right foot EPF, peroneal tendon repair, midfoot exostectomy  65 y.o. male presents with the above complaint. History confirmed with patient.  Objective:  Physical Exam: tenderness at the surgical site, local edema noted, and calf supple, nontender. Decreased sensation dorsal midfoot and 2nd/3rd toe areas. Continued edema dorsal midfoot and ankle area Incision: healed. Assessment:   1. Arthritis, midfoot   2. Post-operative state   3. Peroneal tendinitis of right lower extremity   4. Plantar fasciitis    Plan:  Patient was evaluated and treated and all questions answered.  Post-operative State -Doing well post-operatively.  -Continue transition to normal shoegear. -Continue icing and swelling reduction. -XRs needed at follow-up: none  Return in about 3 weeks (around 10/11/2021) for Post-Op (No XRs).

## 2021-09-25 ENCOUNTER — Telehealth: Payer: Self-pay | Admitting: *Deleted

## 2021-09-25 NOTE — Telephone Encounter (Signed)
Called and spoke with the patient and stated that I had an x-large anklet that patient could try and patient stated that the large anklet was too tight and could not use and messed his foot up and went back into the brace and I stated to come by the office to pick up x-large anklet. Misty Stanley

## 2021-10-11 ENCOUNTER — Encounter: Payer: 59 | Admitting: Podiatry

## 2021-10-18 ENCOUNTER — Other Ambulatory Visit: Payer: Self-pay

## 2021-10-18 ENCOUNTER — Encounter: Payer: Self-pay | Admitting: Podiatry

## 2021-10-18 ENCOUNTER — Ambulatory Visit (INDEPENDENT_AMBULATORY_CARE_PROVIDER_SITE_OTHER): Payer: 59 | Admitting: Podiatry

## 2021-10-18 DIAGNOSIS — M722 Plantar fascial fibromatosis: Secondary | ICD-10-CM

## 2021-10-18 DIAGNOSIS — M19079 Primary osteoarthritis, unspecified ankle and foot: Secondary | ICD-10-CM | POA: Diagnosis not present

## 2021-10-18 DIAGNOSIS — M7671 Peroneal tendinitis, right leg: Secondary | ICD-10-CM

## 2021-10-18 DIAGNOSIS — Z9889 Other specified postprocedural states: Secondary | ICD-10-CM | POA: Diagnosis not present

## 2021-10-18 MED ORDER — CYCLOBENZAPRINE HCL 5 MG PO TABS: 10.0000 mg | ORAL_TABLET | Freq: Three times a day (TID) | ORAL | 0 refills | Status: AC | PRN

## 2021-10-18 MED ORDER — METHYLPREDNISOLONE 4 MG PO TBPK: ORAL_TABLET | ORAL | 0 refills | Status: AC

## 2021-10-18 MED ORDER — CYCLOBENZAPRINE HCL 5 MG PO TABS: 10.0000 mg | ORAL_TABLET | Freq: Three times a day (TID) | ORAL | 0 refills | Status: DC | PRN

## 2021-10-18 NOTE — Progress Notes (Signed)
°  Subjective:  Patient ID: Terry Neal, male    DOB: 1955/10/24,  MRN: [id number]  Chief Complaint  Patient presents with   Routine Post Op    The anklet was too tight and felt like the toes were being crushed and there is some swelling and the shoe is loosened as well on the right foot    DOS: 08/01/21 Procedure: Right foot EPF, peroneal tendon repair, midfoot exostectomy  65 y.o. male presents with the above complaint. History confirmed with patient. Reports pain in the ball of the foot and some occasional spasms. Objective:  Physical Exam: tenderness at the surgical site, local edema noted, and calf supple, nontender. Decreased sensation dorsal midfoot and 2nd/3rd toe areas. Continued edema dorsal midfoot and ankle area Incision: healed. Assessment:   1. Arthritis, midfoot   2. Peroneal tendinitis of right lower extremity   3. Plantar fasciitis   4. Post-operative state    Plan:  Patient was evaluated and treated and all questions answered.  Post-operative State -Doing well post-operatively at his surgical sites. -More forefoot pain than pain at the surgical sites. -Rx medrol pack for residual pain and inflammation. Flexeril PRN spasms. -XRs needed at follow-up: none  Return in about 3 weeks (around 11/08/2021).

## 2021-11-12 ENCOUNTER — Encounter: Payer: Self-pay | Admitting: Podiatry

## 2021-11-12 ENCOUNTER — Ambulatory Visit (INDEPENDENT_AMBULATORY_CARE_PROVIDER_SITE_OTHER): Payer: 59 | Admitting: Podiatry

## 2021-11-12 DIAGNOSIS — M722 Plantar fascial fibromatosis: Secondary | ICD-10-CM

## 2021-11-12 DIAGNOSIS — M19079 Primary osteoarthritis, unspecified ankle and foot: Secondary | ICD-10-CM

## 2021-11-12 DIAGNOSIS — M7671 Peroneal tendinitis, right leg: Secondary | ICD-10-CM

## 2021-11-12 MED ORDER — DICLOFENAC SODIUM 75 MG PO TBEC: 75.0000 mg | DELAYED_RELEASE_TABLET | Freq: Two times a day (BID) | ORAL | 0 refills | Status: DC

## 2021-11-12 MED ORDER — DICLOFENAC SODIUM 75 MG PO TBEC: 75.0000 mg | DELAYED_RELEASE_TABLET | Freq: Two times a day (BID) | ORAL | 0 refills | Status: AC

## 2021-11-19 NOTE — Progress Notes (Signed)
°  Subjective:  Patient ID: Terry Neal, male    DOB: [birth year],  MRN: [id number]  Chief Complaint  Patient presents with   Routine Post Op    Physical therapy is helping and does get stiff and my toes get cold and sore and tender on the right foot    DOS: 08/01/21 Procedure: Right foot EPF, peroneal tendon repair, midfoot exostectomy  66 y.o. male presents with the above complaint. History confirmed with patient. Doing okay still having some pain more in the ball of the foot area but not as much at the surgical sites.  PT is helping.  Pain is rated 3-9 depending on the date and time. Objective:  Physical Exam: tenderness at the surgical site, local edema noted, and calf supple, nontender. Decreased sensation dorsal midfoot and 2nd/3rd toe areas. Continued edema dorsal midfoot and ankle area Incision: healed. Assessment:   1. Arthritis, midfoot   2. Peroneal tendinitis of right lower extremity   3. Plantar fasciitis     Plan:  Patient was evaluated and treated and all questions answered.  Post-operative State -Again offered work restrictions but he states that his work can accommodate this -Rx Voltaren for anti-inflammatory purposes -Continue PT to maximal medical improvement -XRs needed at follow-up: none  Return in about 3 weeks (around 12/03/2021).

## 2021-12-06 ENCOUNTER — Encounter: Payer: 59 | Admitting: Podiatry

## 2021-12-10 ENCOUNTER — Encounter: Payer: Self-pay | Admitting: Podiatry

## 2021-12-10 ENCOUNTER — Ambulatory Visit (INDEPENDENT_AMBULATORY_CARE_PROVIDER_SITE_OTHER): Payer: 59 | Admitting: Podiatry

## 2021-12-10 DIAGNOSIS — M722 Plantar fascial fibromatosis: Secondary | ICD-10-CM

## 2021-12-10 DIAGNOSIS — M792 Neuralgia and neuritis, unspecified: Secondary | ICD-10-CM | POA: Diagnosis not present

## 2021-12-10 DIAGNOSIS — M7671 Peroneal tendinitis, right leg: Secondary | ICD-10-CM

## 2021-12-10 DIAGNOSIS — M19079 Primary osteoarthritis, unspecified ankle and foot: Secondary | ICD-10-CM | POA: Diagnosis not present

## 2021-12-10 MED ORDER — GABAPENTIN 300 MG PO CAPS: 300.0000 mg | ORAL_CAPSULE | Freq: Every day | ORAL | 3 refills | Status: AC

## 2021-12-10 MED ORDER — DEXAMETHASONE SODIUM PHOSPHATE 120 MG/30ML IJ SOLN: 4.0000 mg | Freq: Once | INTRAMUSCULAR | Status: AC

## 2021-12-10 NOTE — Progress Notes (Signed)
°  Subjective:  Patient ID: Terry Neal, male    DOB: 29-Apr-1956,  MRN: [id number]  Chief Complaint  Patient presents with   Routine Post Op    I have been to physical therapy and it has helped and they are doing both feet and the pain has lessoned up and I am ok in the morning and then when I go to work and start walking I can tell    DOS: 08/01/21 Procedure: Right foot EPF, peroneal tendon repair, midfoot exostectomy  66 y.o. male presents with the above complaint. History confirmed with patient. Doing okay relates he is good in the morning but worse after work. .  PT is helping.  Worst thing is the burning pain that starts at is ankle and goes to his toes.   Physical Exam: tenderness at the surgical site, local edema noted, and calf supple, nontender. Decreased sensation dorsal midfoot and 2nd/3rd toe areas. Continued edema dorsal midfoot and ankle area Incision: healed. Assessment:   1. Neuritis   2. Arthritis, midfoot   3. Plantar fasciitis   4. Peroneal tendinitis of right lower extremity      Plan:  Patient was evaluated and treated and all questions answered.  Discussed neuritis as well as peroneal tendonitis and pf and treatment options with patient.  Radiographs reviewed and discussed with patient.  Injection offered today. Patient in agreement. Procedure below.  Discussed compression to help with swelling.   Prescription for gabapentin.  provided.  Discussed if pain does not improve may consider  MRI for further surgical planning.  Patient to return as needed   Procedure: Injection Tendon/Ligament Discussed alternatives, risks, complications and verbal consent was obtained.  Location: Right dorsal midfoot . Skin Prep: Alcohol. Injectate: 1cc 0.5% marcaine plain, 1 cc dexamethasone.  Disposition: Patient tolerated procedure well. Injection site dressed with a band-aid.  Post-injection care was discussed and return precautions discussed.      Return if symptoms  worsen or fail to improve.

## 2022-07-26 ENCOUNTER — Ambulatory Visit: Payer: 59 | Admitting: Podiatry

## 2022-07-26 DIAGNOSIS — M79676 Pain in unspecified toe(s): Secondary | ICD-10-CM

## 2022-07-26 DIAGNOSIS — L600 Ingrowing nail: Secondary | ICD-10-CM | POA: Diagnosis not present

## 2022-07-26 NOTE — Progress Notes (Signed)
  Subjective:  Patient ID: Terry Neal, male    DOB: 1956/01/07,  MRN: [id number]  Chief Complaint  Patient presents with   Ingrown Toenail    66 y.o. male presents with the above complaint. History confirmed with patient.  Patient presents with pain at the lateral border of the bilateral hallux nails.  He has noticed some redness swelling and pain with pressure on the area as well as when while wearing shoes.  Has not noticed much drainage.  He tried to take out 1 side of the ingrown on the right great toe but it was not successful and feels like there is still a piece in there.  Additionally he is having some pain on the left hallux on the outside border as well.  Objective:  Physical Exam: warm, good capillary refill, nail exam normal nails without lesions and ingrown nail at the lateral border of the bilateral hallux nail Incurvation is present along the lateral nail border of the bilateral great toe. There is localized edema without any erythema or increase in warmth around the nail border. There is no drainage or pus. There is no ascending cellulitis. No malodor. No open lesions or pre-ulcerative lesions.  , no trophic changes or ulcerative lesions. DP pulses palpable, PT pulses palpable, and protective sensation intact Left Foot: normal exam, no swelling, tenderness, instability; ligaments intact, full range of motion of all ankle/foot joints  Right Foot: normal exam, no swelling, tenderness, instability; ligaments intact, full range of motion of all ankle/foot joints   No images are attached to the encounter.  Assessment:   1. Ingrown nail of great toe of right foot   2. Ingrown nail of great toe of left foot   3. Pain around toenail      Plan:  Patient was evaluated and treated and all questions answered.  Ingrown Nail, bilateral hallux lateral border -Patient elects to proceed with minor surgery to remove ingrown toenail today. Consent reviewed and signed by patient. -Ingrown  nail excised. See procedure note. -Educated on post-procedure care including soaking. Written instructions provided and reviewed. -Patient to follow up in 2 weeks for nail check.  Procedure: Excision of Ingrown Toenail Location: Bilateral 1st toe lateral nail borders. Anesthesia: Lidocaine 1% plain; 1.5 mL and Marcaine 0.5% plain; 1.5 mL, digital block. Skin Prep: Betadine. Dressing: Silvadene; telfa; dry, sterile, compression dressing. Technique: Following skin prep, the toe was exsanguinated and a tourniquet was secured at the base of the toe. The affected nail border was freed, split with a nail splitter, and excised. Chemical matrixectomy was then performed with phenol and irrigated out with alcohol. The tourniquet was then removed and sterile dressing applied. Disposition: Patient tolerated procedure well. Patient to return in 2 weeks for follow-up.    Return in about 2 weeks (around 08/09/2022) for follow up bilateral hallux lateral ingrown removal.         Everitt Amber, [place] / [facility]

## 2022-07-26 NOTE — Patient Instructions (Signed)

## 2022-08-13 ENCOUNTER — Ambulatory Visit: Payer: 59 | Admitting: Podiatry

## 2022-08-13 DIAGNOSIS — L600 Ingrowing nail: Secondary | ICD-10-CM | POA: Diagnosis not present

## 2022-08-13 NOTE — Progress Notes (Signed)
Subjective: Terry Neal is a 66 y.o.  male returns to office today for follow up evaluation after having bilateral Hallux lateral border nail ingrown removal with phenol and alcohol matrixectomy approximately 2 weeks ago. Patient has been soaking using epsom salts and applying topical antibiotic covered with bandaid daily. Patient denies fevers, chills, nausea, vomiting. Denies any calf pain, chest pain, SOB.   Objective:  Vitals: Reviewed  General: Well developed, nourished, in no acute distress, alert and oriented x3   Dermatology: Skin is warm, dry and supple bilateral. Bilateral hallux nail border appears to be clean, dry, with mild granular tissue and surrounding scab. There is no surrounding erythema, edema, drainage/purulence. The remaining nails appear unremarkable at this time. There are no other lesions or other signs of infection present.  Neurovascular status: Intact. No lower extremity swelling; No pain with calf compression bilateral.  Musculoskeletal: Decreased tenderness to palpation of the bilateral hallux nail fold(s). Muscular strength within normal limits bilateral.   Assesement and Plan: S/p phenol and alcohol matrixectomy to the  bilatera hallux nail lateral border, doing well.   -Continue soaking in epsom salts twice a day followed by antibiotic ointment and a band-aid. Can leave uncovered at night. Continue this until completely healed.  -If the area has not healed in 2 weeks, call the office for follow-up appointment, or sooner if any problems arise.  -Monitor for any signs/symptoms of infection. Call the office immediately if any occur or go directly to the emergency room. Call with any questions/concerns.        Everitt Amber, DPM [facility]                   08/13/2022

## 2022-11-27 ENCOUNTER — Ambulatory Visit: Payer: 59 | Admitting: Podiatry

## 2022-11-27 ENCOUNTER — Encounter: Payer: Self-pay | Admitting: Podiatry

## 2022-11-27 DIAGNOSIS — L603 Nail dystrophy: Secondary | ICD-10-CM

## 2022-11-27 NOTE — Addendum Note (Signed)
Addended by: Ammie Ferrier on: 11/27/2022 03:15 PM   Modules accepted: Orders

## 2022-11-27 NOTE — Progress Notes (Signed)
  Subjective:  Patient ID: Terry Neal, male    DOB: [birth year],   MRN: [id number]  Chief Complaint  Patient presents with   Nail Problem    bil great toe & 2nd toes - nail are changing colors & texture of nails    67 y.o. male presents for new concern of bilateral great toes and second does changing color and texture. The right great toe started two weeks ago the left started a couple months and the left second has been going on for a year . Denies any other pedal complaints. Denies n/v/f/c.   History reviewed. No pertinent past medical history.  Objective:  Physical Exam: Vascular: DP/PT pulses 2/4 bilateral. CFT <3 seconds. Normal hair growth on digits. No edema.  Skin. No lacerations or abrasions bilateral feet. Bilateral hallux nails thickened and discolored and bilateral second digits as well.  Musculoskeletal: MMT 5/5 bilateral lower extremities in DF, PF, Inversion and Eversion. Deceased ROM in DF of ankle joint.  Neurological: Sensation intact to light touch.   Assessment:   1. Onychodystrophy      Plan:  Patient was evaluated and treated and all questions answered. -Examined patient -Discussed treatment options for painful dystrophic nails  -Clinical picture and Fungal culture was obtained by removing a portion of the hard nail itself from each of the involved toenails using a sterile nail nipper and sent to [facility] lab. Patient tolerated the biopsy procedure well without discomfort or need for anesthesia.  -Discussed fungal nail treatment options including oral, topical, and laser treatments.  -Patient to return in 4 weeks for follow up evaluation and discussion of fungal culture results or sooner if symptoms worsen.   Lorenda Peck, DPM

## 2022-12-26 IMAGING — MR MR ANKLE*R* W/O CM
5 series · 40 of 40 positions shown · non-contrast
Comparison: None.

CLINICAL DATA: Ankle pain, chronic, osteoarthritis suspected

EXAM:
MRI OF THE RIGHT ANKLE WITHOUT CONTRAST
TECHNIQUE: Multiplanar, multisequence MR imaging of the ankle was performed. No
intravenous contrast was administered.

[Series 4: T2 fat-sat · axial · 3.0mm · 0.66mm/px · z∈[-119,+13]mm · 8 of 35 slices shown (1 of 2)]
[im 1/35]
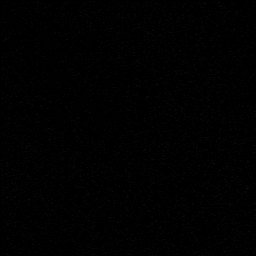
[im 5/35]
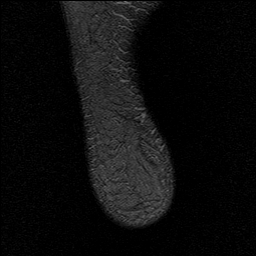
[im 10/35]
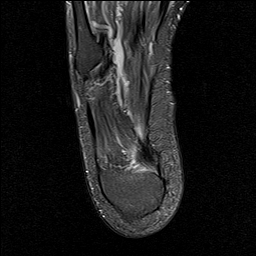
[im 15/35]
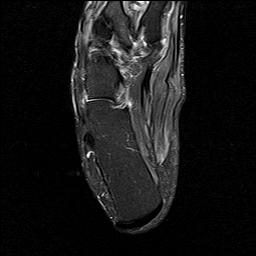
[im 20/35]
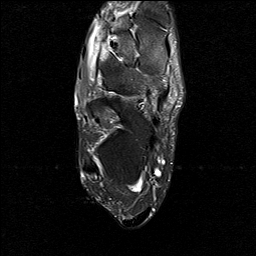
[im 25/35]
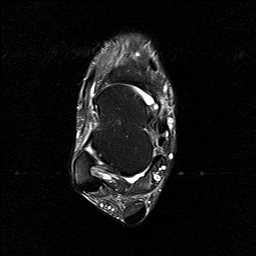
[im 30/35]
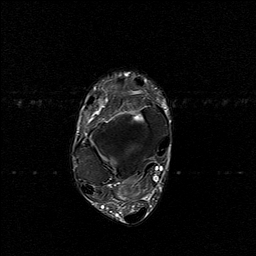
[im 35/35]
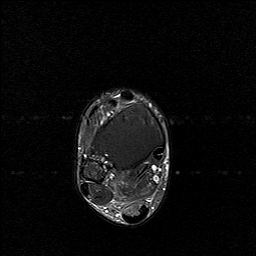

[Series 5: PD fat-sat · axial · 3.0mm · 0.66mm/px · z∈[-119,+13]mm · 9 of 35 slices shown]
[im 1/35]
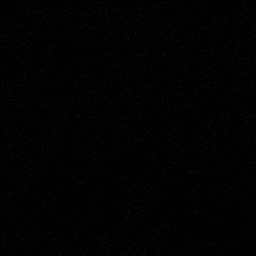
[im 5/35]
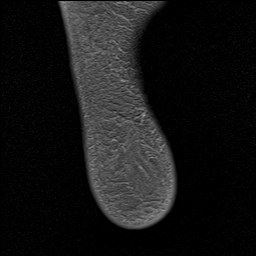
[im 9/35]
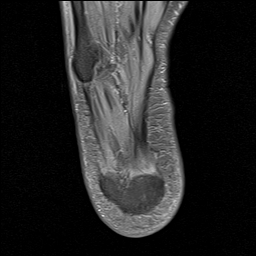
[im 13/35]
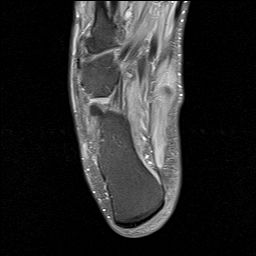
[im 18/35]
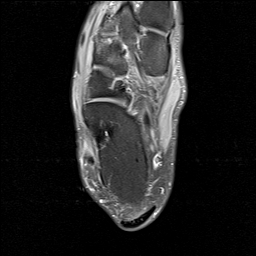
[im 22/35]
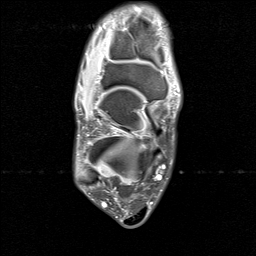
[im 26/35]
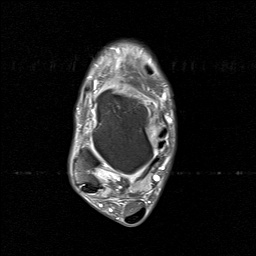
[im 30/35]
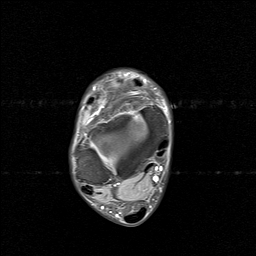
[im 35/35]
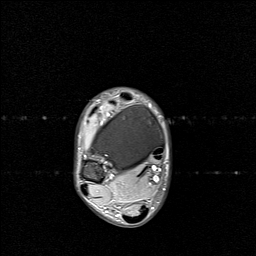

[Series 7: STIR · sagittal · 4.0mm · 0.35mm/px · 6 of 22 slices shown]
[im 1/22]
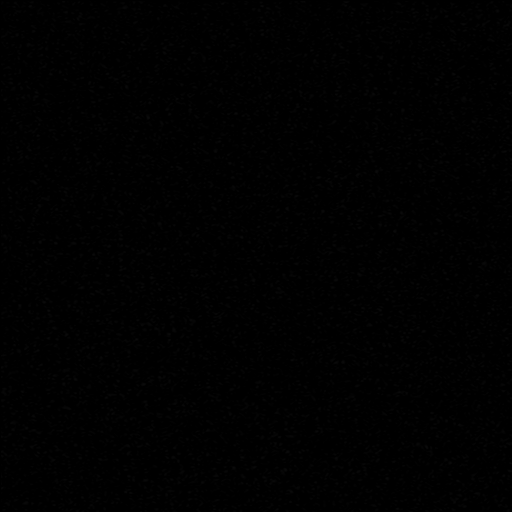
[im 5/22]
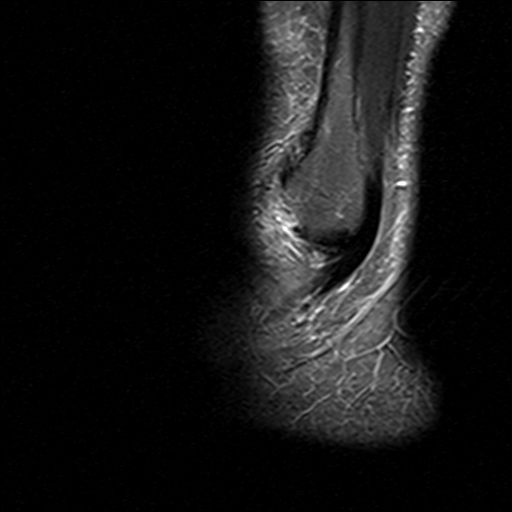
[im 9/22]
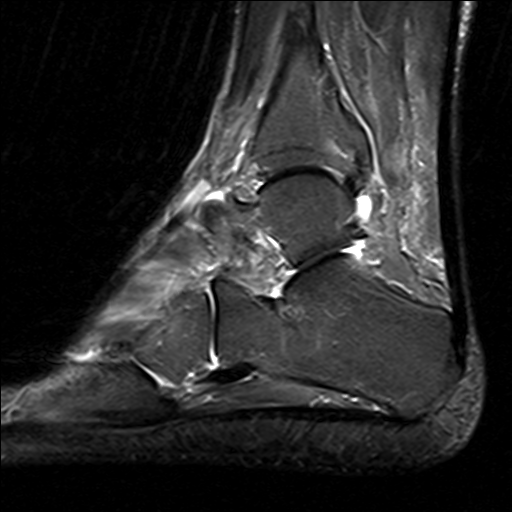
[im 13/22]
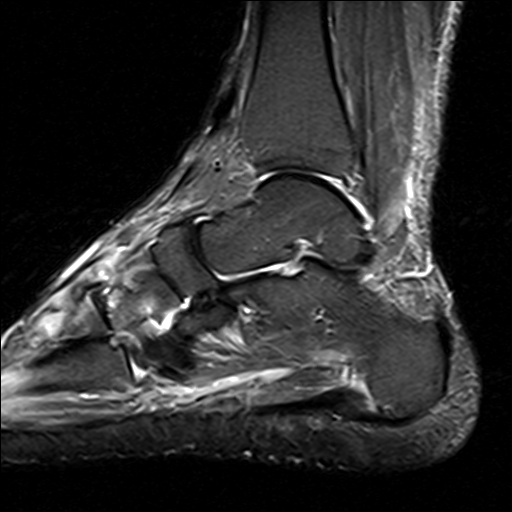
[im 17/22]
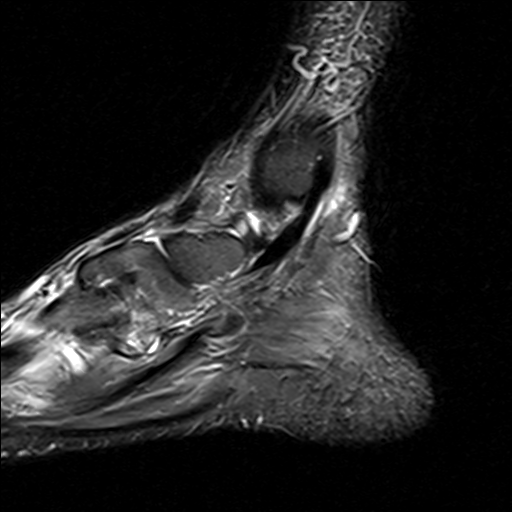
[im 22/22]
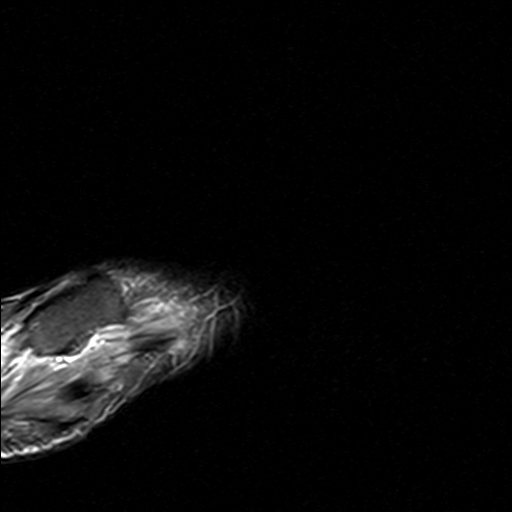

[Series 8: T2 fat-sat · coronal · 3.0mm · 0.62mm/px · 11 of 42 slices shown (2 of 2)]
[im 1/42]
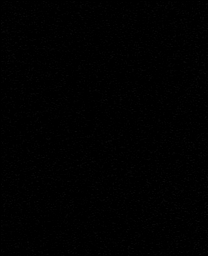
[im 5/42]
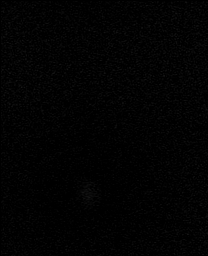
[im 9/42]
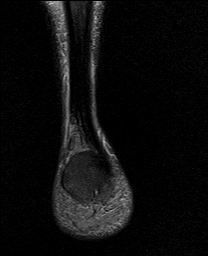
[im 13/42]
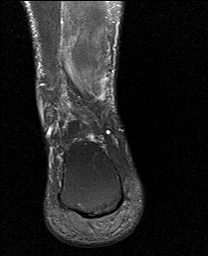
[im 17/42]
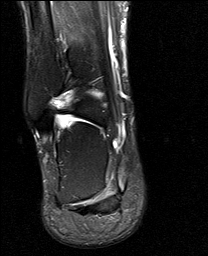
[im 21/42]
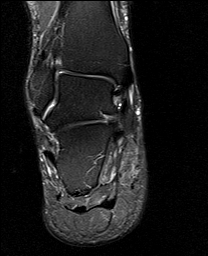
[im 25/42]
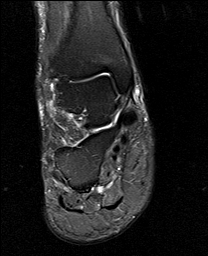
[im 29/42]
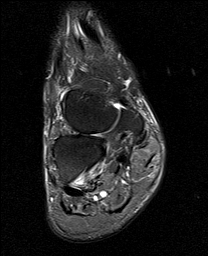
[im 33/42]
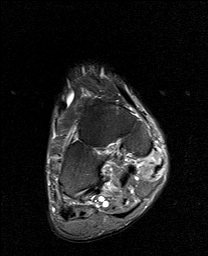
[im 37/42]
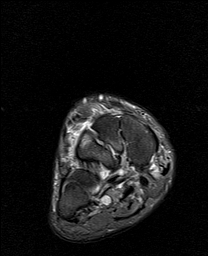
[im 42/42]
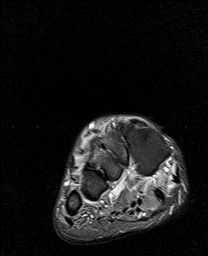

[Series 9: T1 · sagittal · 4.0mm · 0.70mm/px · 6 of 22 slices shown]
[im 1/22]
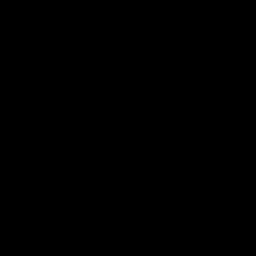
[im 5/22]
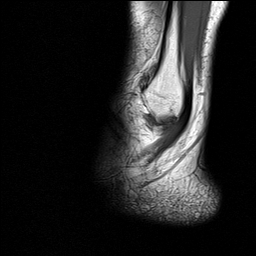
[im 9/22]
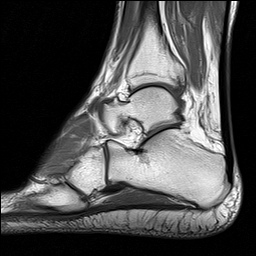
[im 13/22]
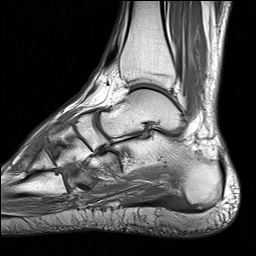
[im 17/22]
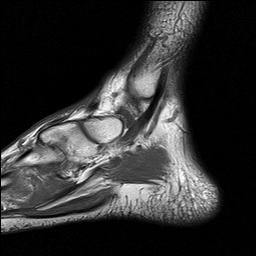
[im 22/22]
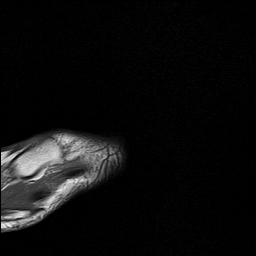

[40 of 40 positions shown; findings below may reference images not displayed]

FINDINGS: TENDONS

Peroneal: Possible small focal split tear of the peroneus brevis
tendon just below the lateral malleolus. Peroneal longus tendon is
intact.

Posteromedial: Posterior tibial tendon intact. Flexor digitorum
longus tendon intact. Flexor hallucis longus tendon intact.

Anterior: Tibialis anterior tendon intact. Extensor hallucis longus
tendon intact Extensor digitorum longus tendon intact.

Achilles:  Intact.

Plantar Fascia: There is thickening of the proximal medial bundle
plantar fascia with increased T2 signal at its insertion on the
calcaneus. The lateral band is intact without increased signal.

LIGAMENTS

Lateral: Anterior talofibular ligament intact. Calcaneofibular
ligament intact. Posterior talofibular ligament intact. Anterior and
posterior tibiofibular ligaments intact.

Medial: Deltoid ligament intact. Spring ligament intact.

CARTILAGE

Ankle Joint: Trace joint effusion. Normal ankle mortise. No chondral
defect.

Subtalar Joints/Sinus Tarsi: Normal subtalar joints. No subtalar
joint effusion. Normal sinus tarsi.

Bones: No acute fracture or dislocation. There is moderate third
tarsometatarsal joint osteoarthritis.

Soft Tissue: No fluid collection or hematoma. Muscles are normal
without edema or atrophy. Tarsal tunnel is normal.
IMPRESSION: Mild plantar fasciitis of the proximal medial bundle at its
insertion on the calcaneus.

Moderate third TMT joint osteoarthritis.

Possible small focal split tear of the peroneus brevis tendon just
below the lateral malleolus.

## 2023-01-01 ENCOUNTER — Ambulatory Visit: Payer: 59 | Admitting: Podiatry

## 2023-01-01 ENCOUNTER — Encounter: Payer: Self-pay | Admitting: Podiatry

## 2023-01-01 DIAGNOSIS — L603 Nail dystrophy: Secondary | ICD-10-CM | POA: Diagnosis not present

## 2023-01-01 NOTE — Progress Notes (Signed)
  Subjective:  Patient ID: Terry Neal, male    DOB: 1956/03/19,   MRN: [id number]  Chief Complaint  Patient presents with   Nail Problem    fungal nail     67 y.o. male presents for follow-up of fungal nails and to review cultureDenies any other pedal complaints. Denies n/v/f/c.   No past medical history on file.  Objective:  Physical Exam: Vascular: DP/PT pulses 2/4 bilateral. CFT <3 seconds. Normal hair growth on digits. No edema.  Skin. No lacerations or abrasions bilateral feet. Bilateral hallux nails thickened and discolored and bilateral second digits as well.  Musculoskeletal: MMT 5/5 bilateral lower extremities in DF, PF, Inversion and Eversion. Deceased ROM in DF of ankle joint.  Neurological: Sensation intact to light touch.   Assessment:   1. Onychodystrophy       Plan:  Patient was evaluated and treated and all questions answered. -Examined patient -Discussed treatment options for painful dystrophic nails  -Cultures reviewed and negative for any fungus.  -Discussed use of urea nail gel.  -Patient to return as needed.    Lorenda Peck, DPM

## 2023-10-31 ENCOUNTER — Ambulatory Visit: Payer: 59 | Admitting: Podiatry

## 2023-12-11 ENCOUNTER — Ambulatory Visit: Payer: 59 | Admitting: Podiatry

## 2023-12-12 ENCOUNTER — Ambulatory Visit: Payer: 59 | Admitting: Podiatry

## 2023-12-12 DIAGNOSIS — L539 Erythematous condition, unspecified: Secondary | ICD-10-CM | POA: Diagnosis not present

## 2023-12-12 DIAGNOSIS — L600 Ingrowing nail: Secondary | ICD-10-CM

## 2023-12-12 MED ORDER — DOXYCYCLINE HYCLATE 100 MG PO TABS: 100.0000 mg | ORAL_TABLET | Freq: Two times a day (BID) | ORAL | 0 refills | Status: AC

## 2023-12-12 NOTE — Progress Notes (Signed)
 Subjective:  Patient ID: Terry Neal, male    DOB: 12-11-1955,  MRN: [id number]  Chief Complaint  Patient presents with   Toe Pain    68 y.o. male presents with the above complaint.  Patient presents with left hallux medial border ingrown painful to touch.  He had it removed in the past he would like to discuss next treatment plan it seems like it started low back he would like to do it again denies any other.  There are some erythema is currently not taking any antibiotics   Review of Systems: Negative except as noted in the HPI. Denies N/V/F/Ch.  No past medical history on file.  Current Outpatient Medications:    doxycycline (VIBRA-TABS) 100 MG tablet, Take 1 tablet (100 mg total) by mouth 2 (two) times daily., Disp: 20 tablet, Rfl: 0   aspirin 81 MG chewable tablet, Chew by mouth., Disp: , Rfl:    atorvastatin (LIPITOR) 40 MG tablet, TAKE 1 TABLET (40 MG TOTAL) BY MOUTH DAILY., Disp: , Rfl:    co-enzyme Q-10 30 MG capsule, Take 30 mg by mouth 3 (three) times daily., Disp: , Rfl:    cyclobenzaprine (FLEXERIL) 5 MG tablet, Take 2 tablets (10 mg total) by mouth 3 (three) times daily as needed for muscle spasms., Disp: 30 tablet, Rfl: 0   diclofenac (VOLTAREN) 75 MG EC tablet, Take 1 tablet (75 mg total) by mouth 2 (two) times daily. Take with meals., Disp: 30 tablet, Rfl: 0   fluticasone (FLONASE) 50 MCG/ACT nasal spray, Place into the nose., Disp: , Rfl:    gabapentin (NEURONTIN) 300 MG capsule, Take 1 capsule (300 mg total) by mouth at bedtime., Disp: 90 capsule, Rfl: 3   irbesartan-hydrochlorothiazide (AVALIDE) 300-12.5 MG tablet, Take by mouth., Disp: , Rfl:    meloxicam (MOBIC) 15 MG tablet, Take 1 tablet (15 mg total) by mouth daily., Disp: 30 tablet, Rfl: 0   methylPREDNISolone (MEDROL DOSEPAK) 4 MG TBPK tablet, 6 Day Taper Pack. Take as Directed., Disp: 21 tablet, Rfl: 0   pantoprazole (PROTONIX) 40 MG tablet, TAKE 1 TABLET DAILY FOR    REFLUX, Disp: , Rfl:   Current  Facility-Administered Medications:    dexamethasone (DECADRON) injection 4 mg, 4 mg, Intra-articular, Once, Louann Sjogren, DPM  Social History   Tobacco Use  Smoking Status Unknown  Smokeless Tobacco Never    No Known Allergies Objective:  There were no vitals filed for this visit. There is no height or weight on file to calculate BMI. Constitutional Well developed. Well nourished.  Vascular Dorsalis pedis pulses palpable bilaterally. Posterior tibial pulses palpable bilaterally. Capillary refill normal to all digits.  No cyanosis or clubbing noted. Pedal hair growth normal.  Neurologic Normal speech. Oriented to person, place, and time. Epicritic sensation to light touch grossly present bilaterally.  Dermatologic Painful ingrowing nail at medial nail borders of the hallux nail left. No other open wounds. No skin lesions.  Orthopedic: Normal joint ROM without pain or crepitus bilaterally. No visible deformities. No bony tenderness.   Radiographs: None Assessment:   1. Erythema   2. Ingrown nail of great toe of left foot    Plan:  Patient was evaluated and treated and all questions answered.  Ingrown Nail, left -Patient elects to proceed with minor surgery to remove ingrown toenail removal today. Consent reviewed and signed by patient. -Ingrown nail excised. See procedure note. -Educated on post-procedure care including soaking. Written instructions provided and reviewed. -Patient to follow up in 2 weeks for  nail check.  Procedure: Excision of Ingrown Toenail Location: Left 1st toe medial nail borders. Anesthesia: Lidocaine 1% plain; 1.5 mL and Marcaine 0.5% plain; 1.5 mL, digital block. Skin Prep: Betadine. Dressing: Silvadene; telfa; dry, sterile, compression dressing. Technique: Following skin prep, the toe was exsanguinated and a tourniquet was secured at the base of the toe. The affected nail border was freed, split with a nail splitter, and excised. Chemical  matrixectomy was then performed with phenol and irrigated out with alcohol. The tourniquet was then removed and sterile dressing applied. Disposition: Patient tolerated procedure well. Patient to return in 2 weeks for follow-up.   No follow-ups on file.
